# Patient Record
Sex: Female | Born: 1955 | Race: Black or African American | Hispanic: No | Marital: Single | Smoking: Former smoker
Health system: Southern US, Community
[De-identification: ages and names within clinical notes are randomized; demographics above are authoritative.]

## PROBLEM LIST (undated history)

## (undated) DIAGNOSIS — D1803 Hemangioma of intra-abdominal structures: Secondary | ICD-10-CM

## (undated) DIAGNOSIS — Z8619 Personal history of other infectious and parasitic diseases: Secondary | ICD-10-CM

## (undated) DIAGNOSIS — M25562 Pain in left knee: Secondary | ICD-10-CM

## (undated) DIAGNOSIS — Z9889 Other specified postprocedural states: Secondary | ICD-10-CM

## (undated) DIAGNOSIS — G8929 Other chronic pain: Secondary | ICD-10-CM

## (undated) DIAGNOSIS — M8889 Osteitis deformans of multiple sites: Secondary | ICD-10-CM

## (undated) DIAGNOSIS — M25561 Pain in right knee: Secondary | ICD-10-CM

## (undated) DIAGNOSIS — F4321 Adjustment disorder with depressed mood: Secondary | ICD-10-CM

## (undated) DIAGNOSIS — Z87898 Personal history of other specified conditions: Secondary | ICD-10-CM

## (undated) DIAGNOSIS — T783XXA Angioneurotic edema, initial encounter: Secondary | ICD-10-CM

## (undated) DIAGNOSIS — Z634 Disappearance and death of family member: Secondary | ICD-10-CM

## (undated) DIAGNOSIS — B019 Varicella without complication: Secondary | ICD-10-CM

## (undated) DIAGNOSIS — G5601 Carpal tunnel syndrome, right upper limb: Secondary | ICD-10-CM

## (undated) HISTORY — DX: Angioneurotic edema, initial encounter: T78.3XXA

## (undated) HISTORY — DX: Pain in right knee: M25.561

## (undated) HISTORY — DX: Personal history of other infectious and parasitic diseases: Z86.19

## (undated) HISTORY — DX: Other specified postprocedural states: Z98.890

## (undated) HISTORY — DX: Other disorders of bilirubin metabolism: E80.6

## (undated) HISTORY — DX: Disappearance and death of family member: Z63.4

## (undated) HISTORY — DX: Personal history of other specified conditions: Z87.898

## (undated) HISTORY — DX: Adjustment disorder with depressed mood: F43.21

## (undated) HISTORY — DX: Varicella without complication: B01.9

## (undated) HISTORY — DX: Other chronic pain: G89.29

## (undated) HISTORY — DX: Pain in left knee: M25.562

## (undated) HISTORY — DX: Osteitis deformans of multiple sites: M88.89

## (undated) HISTORY — DX: Carpal tunnel syndrome, right upper limb: G56.01

## (undated) HISTORY — DX: Hemangioma of intra-abdominal structures: D18.03

---

## 1980-03-28 HISTORY — PX: BREAST CYST EXCISION: SHX579

## 1988-03-28 HISTORY — PX: TUBAL LIGATION: SHX77

## 2000-03-28 HISTORY — PX: BREAST REDUCTION SURGERY: SHX8

## 2002-03-28 HISTORY — PX: UMBILICAL HERNIA REPAIR: SHX196

## 2009-07-31 ENCOUNTER — Emergency Department (HOSPITAL_COMMUNITY): Admission: EM | Admit: 2009-07-31 | Discharge: 2009-07-31 | Payer: Self-pay | Admitting: Emergency Medicine

## 2015-03-29 HISTORY — PX: LASIK: SHX215

## 2016-09-06 ENCOUNTER — Other Ambulatory Visit (HOSPITAL_COMMUNITY)
Admission: RE | Admit: 2016-09-06 | Discharge: 2016-09-06 | Disposition: A | Discharge status: Home / Self Care | Source: Ambulatory Visit | Attending: Family Medicine | Admitting: Family Medicine

## 2016-09-06 ENCOUNTER — Other Ambulatory Visit: Payer: Self-pay | Admitting: Family Medicine

## 2016-09-06 DIAGNOSIS — Z113 Encounter for screening for infections with a predominantly sexual mode of transmission: Secondary | ICD-10-CM | POA: Diagnosis not present

## 2016-09-09 ENCOUNTER — Ambulatory Visit
Admission: RE | Admit: 2016-09-09 | Discharge: 2016-09-09 | Disposition: A | Discharge status: Home / Self Care | Source: Ambulatory Visit | Attending: Family Medicine | Admitting: Family Medicine

## 2016-09-09 ENCOUNTER — Other Ambulatory Visit: Payer: Self-pay | Admitting: Family Medicine

## 2016-09-09 DIAGNOSIS — R0981 Nasal congestion: Secondary | ICD-10-CM

## 2016-09-12 LAB — URINE CYTOLOGY ANCILLARY ONLY
Bacterial vaginitis: POSITIVE — AB
CHLAMYDIA, DNA PROBE: NEGATIVE
Candida vaginitis: NEGATIVE
NEISSERIA GONORRHEA: NEGATIVE
Trichomonas: NEGATIVE

## 2016-11-30 ENCOUNTER — Other Ambulatory Visit: Payer: Self-pay | Admitting: Family Medicine

## 2016-11-30 ENCOUNTER — Ambulatory Visit
Admission: RE | Admit: 2016-11-30 | Discharge: 2016-11-30 | Disposition: A | Discharge status: Home / Self Care | Source: Ambulatory Visit | Attending: Family Medicine | Admitting: Family Medicine

## 2016-11-30 DIAGNOSIS — M79671 Pain in right foot: Secondary | ICD-10-CM

## 2016-12-02 ENCOUNTER — Encounter (INDEPENDENT_AMBULATORY_CARE_PROVIDER_SITE_OTHER): Payer: Self-pay | Admitting: Neurology

## 2016-12-02 ENCOUNTER — Ambulatory Visit (INDEPENDENT_AMBULATORY_CARE_PROVIDER_SITE_OTHER): Admitting: Neurology

## 2016-12-02 DIAGNOSIS — R202 Paresthesia of skin: Secondary | ICD-10-CM | POA: Diagnosis not present

## 2016-12-02 DIAGNOSIS — G5601 Carpal tunnel syndrome, right upper limb: Secondary | ICD-10-CM

## 2016-12-02 DIAGNOSIS — Z0289 Encounter for other administrative examinations: Secondary | ICD-10-CM

## 2016-12-02 HISTORY — DX: Carpal tunnel syndrome, right upper limb: G56.01

## 2016-12-02 NOTE — Procedures (Signed)
Full Name: Cataleyah Colborn Gender: Female MRN #: [id number] Date of Birth: 2055/09/15    Visit Date: 12/02/16 08:19 Age: 61 Years 91 Months Old Examining Physician: Marcial Pacas, MD  Referring Physician: Dr. Lucianne Lei, MD History:  61 years old right-handed female presenting with intermittent bilateral hands paresthesia, right worse than left.  Summary of the test:  Nerve conduction study: Right median mixed response was 0.4 milliseconds prolonged in comparison to the ipsilateral ulnar mixed response. Bilateral median, right ulnar sensory responses were normal. Bilateral median, right ulnar motor responses were normal.  Electromyography: Selected needle examination of right upper extremity and right cervical paraspinal muscles were normal.  Conclusion: This is a mild abnormal study. There is electrodiagnostic evidence of right median neuropathy across the wrist, consistent with mild right carpal tunnel syndromes. There is no evidence of right cervical radiculopathy.    ------------------------------- Marcial Pacas, M.D.  [facility] [place], [place] [address] Tel: [phone] Fax: [phone]        [facility]    Nerve / Sites Muscle Latency Ref. Amplitude Ref. Rel Amp Segments Distance Velocity Ref. Area    ms ms mV mV %  cm m/s m/s mVms  L Median - APB     Wrist APB 2.8 ?4.4 8.4 ?4.0 100 Wrist - APB 7   33.8     Upper arm APB 6.6  8.3  98.6 Upper arm - Wrist 21 56 ?49 30.3  R Median - APB     Wrist APB 3.1 ?4.4 8.7 ?4.0 100 Wrist - APB 7   32.4     Upper arm APB 6.8  8.5  98.3 Upper arm - Wrist   ?49 32.1  R Ulnar - ADM     Wrist ADM 2.2 ?3.3 7.9 ?6.0 100 Wrist - ADM 7   27.7     B.Elbow ADM 5.5  7.4  93.6 B.Elbow - Wrist 18 55 ?49 26.9     A.Elbow ADM 7.8  7.4  99.3 A.Elbow - B.Elbow 12 51 ?49 27.0         A.Elbow - Wrist               SNC    Nerve / Sites Rec. Site Peak Lat Ref.  Amp Ref. Segments Distance Peak Diff Ref.    ms ms V  V  cm ms ms  L Median, Ulnar - Transcarpal comparison     Median Palm Wrist 1.9 ?2.2 50 ?35 Median Palm - Wrist 8       Ulnar Palm Wrist 2.0 ?2.2 14 ?12 Ulnar Palm - Wrist 8          Median Palm - Ulnar Palm  -0.1 ?0.4  R Median, Ulnar - Transcarpal comparison     Median Palm Wrist 2.3 ?2.2 41 ?35 Median Palm - Wrist 8       Ulnar Palm Wrist 1.9 ?2.2 11 ?12 Ulnar Palm - Wrist 8          Median Palm - Ulnar Palm  0.4 ?0.4  L Median - Orthodromic (Dig II, Mid palm)     Dig II Wrist 2.8 ?3.4 34 ?10 Dig II - Wrist 13    R Median - Orthodromic (Dig II, Mid palm)     Dig II Wrist 3.1 ?3.4 12 ?10 Dig II - Wrist 13    R Ulnar - Orthodromic, (Dig V, Mid palm)     Dig V Wrist  2.8 ?3.1 9 ?5 Dig V - Wrist 26                 F  Wave    Nerve F Lat Ref.   ms ms  R Ulnar - ADM 25.7 ?32.0       EMG full       EMG Summary Table    Spontaneous MUAP Recruitment  Muscle IA Fib PSW Fasc Other Amp Dur. Poly Pattern  R. First dorsal interosseous Normal None None None _______ Normal Normal Normal Normal  R. Pronator teres Normal None None None _______ Normal Normal Normal Normal  R. Deltoid Normal None None None _______ Normal Normal Normal Normal  R. Biceps brachii Normal None None None _______ Normal Normal Normal Normal  R. Extensor digitorum communis Normal None None None _______ Normal Normal Normal Normal  R. Cervical paraspinals Normal None None None _______ Normal Normal Normal Normal

## 2017-03-02 ENCOUNTER — Other Ambulatory Visit: Payer: Self-pay | Admitting: Family Medicine

## 2017-03-02 DIAGNOSIS — K769 Liver disease, unspecified: Secondary | ICD-10-CM

## 2017-03-08 ENCOUNTER — Telehealth: Payer: Self-pay | Admitting: Radiology

## 2017-03-08 ENCOUNTER — Inpatient Hospital Stay
Admission: RE | Admit: 2017-03-08 | Discharge: 2017-03-08 | Disposition: A | Discharge status: Home / Self Care | Source: Ambulatory Visit | Attending: Family Medicine | Admitting: Family Medicine

## 2017-03-08 NOTE — Telephone Encounter (Signed)
Called 13 hour prep to [facility]. Called pt and explained prep. First dose 12/13  8 pm, 2nd dose 12/14 2 am,3rd dose  12/14 8 am with Benadryl 50 mg po.

## 2017-03-10 ENCOUNTER — Ambulatory Visit
Admission: RE | Admit: 2017-03-10 | Discharge: 2017-03-10 | Disposition: A | Discharge status: Home / Self Care | Source: Ambulatory Visit | Attending: Family Medicine | Admitting: Family Medicine

## 2017-03-10 DIAGNOSIS — K769 Liver disease, unspecified: Secondary | ICD-10-CM

## 2017-03-10 MED ORDER — IOPAMIDOL (ISOVUE-300) INJECTION 61%
100.0000 mL | Freq: Once | INTRAVENOUS | Status: AC | PRN
  Administered 2017-03-10: 100 mL via INTRAVENOUS

## 2017-03-14 ENCOUNTER — Other Ambulatory Visit: Payer: Self-pay | Admitting: Family Medicine

## 2017-03-14 ENCOUNTER — Ambulatory Visit
Admission: RE | Admit: 2017-03-14 | Discharge: 2017-03-14 | Disposition: A | Discharge status: Home / Self Care | Source: Ambulatory Visit | Attending: Family Medicine | Admitting: Family Medicine

## 2017-03-14 DIAGNOSIS — S32302A Unspecified fracture of left ilium, initial encounter for closed fracture: Secondary | ICD-10-CM

## 2017-05-23 LAB — HM MAMMOGRAPHY

## 2017-06-16 LAB — HM MAMMOGRAPHY

## 2017-11-14 ENCOUNTER — Other Ambulatory Visit: Payer: Self-pay | Admitting: Family Medicine

## 2017-11-14 ENCOUNTER — Ambulatory Visit
Admission: RE | Admit: 2017-11-14 | Discharge: 2017-11-14 | Disposition: A | Discharge status: Home / Self Care | Source: Ambulatory Visit | Attending: Family Medicine | Admitting: Family Medicine

## 2017-11-14 DIAGNOSIS — M130 Polyarthritis, unspecified: Secondary | ICD-10-CM

## 2017-11-14 DIAGNOSIS — J0100 Acute maxillary sinusitis, unspecified: Secondary | ICD-10-CM

## 2018-01-02 DIAGNOSIS — M25562 Pain in left knee: Secondary | ICD-10-CM

## 2018-01-02 DIAGNOSIS — G8929 Other chronic pain: Secondary | ICD-10-CM | POA: Insufficient documentation

## 2018-01-02 DIAGNOSIS — M545 Low back pain, unspecified: Secondary | ICD-10-CM | POA: Insufficient documentation

## 2018-01-02 DIAGNOSIS — M25561 Pain in right knee: Secondary | ICD-10-CM

## 2018-01-02 HISTORY — DX: Other chronic pain: G89.29

## 2018-05-25 ENCOUNTER — Telehealth: Payer: Self-pay | Admitting: Family Medicine

## 2018-05-25 NOTE — Telephone Encounter (Signed)
Called pt to reschedule appt on 06/26/2018 for NP appt. Informed pt that next available was July. Pt would like sooner if possible. Please advise.

## 2018-05-28 NOTE — Telephone Encounter (Signed)
Patient is calling back to see if she can be worked in sooner. Please advise. Thank you

## 2018-05-28 NOTE — Telephone Encounter (Signed)
Okay to use your discretion.

## 2018-05-28 NOTE — Telephone Encounter (Signed)
Call patient to work into same day slot

## 2018-06-14 DIAGNOSIS — Z9889 Other specified postprocedural states: Secondary | ICD-10-CM

## 2018-06-14 HISTORY — DX: Other specified postprocedural states: Z98.890

## 2018-06-15 ENCOUNTER — Ambulatory Visit (INDEPENDENT_AMBULATORY_CARE_PROVIDER_SITE_OTHER): Admitting: Family Medicine

## 2018-06-15 ENCOUNTER — Other Ambulatory Visit: Payer: Self-pay

## 2018-06-15 ENCOUNTER — Ambulatory Visit (INDEPENDENT_AMBULATORY_CARE_PROVIDER_SITE_OTHER)

## 2018-06-15 ENCOUNTER — Encounter: Payer: Self-pay | Admitting: Family Medicine

## 2018-06-15 VITALS — BP 120/78 | HR 66 | Temp 98.1°F | Ht 64.0 in | Wt 171.5 lb

## 2018-06-15 DIAGNOSIS — G8929 Other chronic pain: Secondary | ICD-10-CM | POA: Diagnosis not present

## 2018-06-15 DIAGNOSIS — M25551 Pain in right hip: Secondary | ICD-10-CM | POA: Diagnosis not present

## 2018-06-15 DIAGNOSIS — D1803 Hemangioma of intra-abdominal structures: Secondary | ICD-10-CM

## 2018-06-15 DIAGNOSIS — M533 Sacrococcygeal disorders, not elsewhere classified: Secondary | ICD-10-CM

## 2018-06-15 DIAGNOSIS — M545 Low back pain, unspecified: Secondary | ICD-10-CM

## 2018-06-15 DIAGNOSIS — F4321 Adjustment disorder with depressed mood: Secondary | ICD-10-CM

## 2018-06-15 DIAGNOSIS — G5601 Carpal tunnel syndrome, right upper limb: Secondary | ICD-10-CM

## 2018-06-15 DIAGNOSIS — Z634 Disappearance and death of family member: Secondary | ICD-10-CM

## 2018-06-15 DIAGNOSIS — I5181 Takotsubo syndrome: Secondary | ICD-10-CM

## 2018-06-15 DIAGNOSIS — M8889 Osteitis deformans of multiple sites: Secondary | ICD-10-CM

## 2018-06-15 DIAGNOSIS — Z87898 Personal history of other specified conditions: Secondary | ICD-10-CM

## 2018-06-15 NOTE — Progress Notes (Signed)
Natalie Cline is a 63 y.o. female is here to [place].   Patient Care Team: Briscoe Deutscher, DO as PCP - General (Family Medicine)   History of Present Illness:   HPI: This is a pleasant 63 year old woman presenting for new patient visit.  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  She does have chronic pain in those areas but is active and has good range of motion.  She is concerned that this has not been followed up.  She also reports that she will have an MRI in the near future to evaluate a liver lesion.  Review of her history shows that she had a CT in 2016/07/03 that revealed a liver hemangioma.  It appears benign.  A chart review also reveals an elevated bilirubin lab.  Patient reports that she will be getting thyroid labs in the next week, again at the [place].  Patient had multiple exposures during her career in the [employer].  She never went to [place], but was in [employer] during the 90s and stationed in [place], [place], and [place].  After early retirement from [employer], she worked in [employer] another viable department for many years.  She retired.  Now she works very part-time at [employer] in the [employer]  Patient eats well and tries exercise.  She is married.  She has 3 children, all vaginal deliveries.  In 07/04/2014 her son died due to "hypertension."  The patient is seeing a counselor to continue coping mechanisms for her grief.  She has 2 daughters that are both healthy.  In 2014/07/04, shortly after her son's death, she had chest pain and was worked up with normal labs and images and studies.  Patient states that she was told she had broken heart syndrome.  Health Maintenance Due  Topic Date Due  . Hepatitis C Screening  1957/03/13  . HIV Screening  05/12/1971   Depression screen PHQ 2/9 06/15/2018  Decreased Interest 0  Down, Depressed,  Hopeless 0  PHQ - 2 Score 0    PMHx, SurgHx, SocialHx, Medications, and Allergies were reviewed in the Visit Navigator and updated as appropriate.   Past Medical History:  Diagnosis Date  . Bilateral chronic knee pain 01/02/2018  . Grief at loss of child, son died in his 70s of heart disease 06/17/2018  . History of chest pain, 2014-07-04, after loss of son, normal cardiac testing 06/17/2018  . History of chicken pox   . History of genital warts   . Hx of breast reduction, elective 06/14/2018  . Hyperbilirubinemia 06/17/2018  . Liver hemangioma 06/17/2018   CT July 03, 2016. Hepatobiliary: Normal liver size. No evidence of liver surface irregularity. There is a 2.3 x 2.3 cm superior right liver lobe mass (series 4/image 20) demonstrating progressive discontinuous peripheral nodular enhancement, compatible with a benign hemangioma. There are 3 scattered subcentimeter hypodense lesions in the right liver lobe, too small to characterize, which require no follow-  . Mild carpal tunnel syndrome of right wrist 12/02/2016  . 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 Vaginal delivery, x 3      Past Surgical History:  Procedure Laterality Date  . BREAST REDUCTION SURGERY Bilateral 07/03/2000  . LASIK Bilateral 07-04-15  . TUBAL LIGATION  1990  . UMBILICAL HERNIA REPAIR  07/04/02  Family History  Problem Relation Age of Onset  . Hypertension Son     Social History   Tobacco Use  . Smoking status: Former Smoker    Years: 2.00  . Smokeless tobacco: Never Used  Substance Use Topics  . Alcohol use: Yes    Comment: 1-2 glasses of wine per month  . Drug use: Never    Current Medications and Allergies   .  ALLERGY NON-DROWSY 10 MG tablet, Take 10 mg by mouth daily as needed. , Disp: , Rfl:  .  baclofen (LIORESAL) 10 MG tablet, Take 10 mg by mouth daily., Disp: , Rfl:  .  calcium-vitamin D 250-100 MG-UNIT tablet, Take 1 tablet by mouth daily., Disp: , Rfl:  .  diclofenac (VOLTAREN) 75 MG EC tablet,  Take 75 mg by mouth daily., Disp: , Rfl:  .  guaifenesin (HUMIBID E) 400 MG TABS tablet, Take 400 mg by mouth as needed. , Disp: , Rfl:  .  Multiple Vitamin (MULTIVITAMIN) tablet, Take 1 tablet by mouth daily., Disp: , Rfl:  .  valACYclovir (VALTREX) 1000 MG tablet, Take 1,000 mg by mouth daily. , Disp: , Rfl:    Allergies  Allergen Reactions  . Iodinated Diagnostic Agents Hives    Per patient   . Penicillins Other (See Comments)   Review of Systems   Pertinent items are noted in the HPI. Otherwise, a complete ROS is negative.  Vitals   Vitals:   06/15/18 1137  BP: 120/78  Pulse: 66  Temp: 98.1 F (36.7 C)  TempSrc: Oral  SpO2: 99%  Weight: 171 lb 8 oz (77.8 kg)  Height: 5\' 4"  (1.626 m)     Body mass index is 29.44 kg/m.  Physical Exam   Physical Exam Vitals signs and nursing note reviewed.  Constitutional:      General: She is not in acute distress.    Appearance: She is well-developed.  HENT:     Head: Normocephalic and atraumatic.     Right Ear: External ear normal.     Left Ear: External ear normal.     Nose: Nose normal.  Eyes:     Conjunctiva/sclera: Conjunctivae normal.     Pupils: Pupils are equal, round, and reactive to light.  Neck:     Musculoskeletal: Normal range of motion and neck supple.     Thyroid: No thyromegaly.  Cardiovascular:     Rate and Rhythm: Normal rate and regular rhythm.     Heart sounds: Normal heart sounds.  Pulmonary:     Effort: Pulmonary effort is normal.     Breath sounds: Normal breath sounds.  Abdominal:     General: Bowel sounds are normal.     Palpations: Abdomen is soft.  Musculoskeletal: Normal range of motion.     Lumbar back: She exhibits bony tenderness. She exhibits normal range of motion.       Back:  Lymphadenopathy:     Cervical: No cervical adenopathy.  Skin:    General: Skin is warm and dry.     Capillary Refill: Capillary refill takes less than 2 seconds.  Neurological:     Mental Status: She is  alert and oriented to person, place, and time.  Psychiatric:        Behavior: Behavior normal.     Assessment and Plan   Natalie Cline was seen today for establish care and back pain.  X-rays completed today.  Will review records thoroughly.  Will request records from [place].  Will hold  off on labs today since she will be having them in the near future at the [place].  We have asked her to send [place] many records as possible to [place] ASAP.  Diagnoses and all orders for this visit:  Pain of right hip joint -     DG Hip Unilat W OR W/O Pelvis Min 4 Views Right; Future  Lumbar pain -     DG Lumbar Spine 2-3 Views; Future  Chronic SI joint pain -     DG Si Joints; Future  Mild carpal tunnel syndrome of right wrist  Hyperbilirubinemia  Liver hemangioma  Broken heart syndrome  Grief at loss of child, son died in his 63s of heart disease  Paget's disease of bone at multiple sites  History of chest pain, 2016, after loss of son, normal cardiac testing  . Orders and follow up as documented in [place], reviewed diet, exercise and weight control, cardiovascular risk and specific lipid/LDL goals reviewed, reviewed medications and side effects in detail.  . Reviewed expectations re: course of current medical issues. . Outlined signs and symptoms indicating need for more acute intervention. . Patient verbalized understanding and all questions were answered. . Patient received an After Visit Summary.  Briscoe Deutscher, DO [place] [place] 06/17/2018  Records requested if needed. Time spent with the patient: 45 minutes, of which >50% was spent in obtaining information about her symptoms, reviewing her previous labs, evaluations, and treatments, counseling her about her condition (please see the discussed topics above), and developing a plan to further investigate it; she had a number of questions which I addressed.

## 2018-06-16 ENCOUNTER — Encounter: Payer: Self-pay | Admitting: Family Medicine

## 2018-06-17 ENCOUNTER — Encounter: Payer: Self-pay | Admitting: Family Medicine

## 2018-06-17 DIAGNOSIS — F4321 Adjustment disorder with depressed mood: Secondary | ICD-10-CM | POA: Insufficient documentation

## 2018-06-17 DIAGNOSIS — M8889 Osteitis deformans of multiple sites: Secondary | ICD-10-CM

## 2018-06-17 DIAGNOSIS — Z634 Disappearance and death of family member: Secondary | ICD-10-CM

## 2018-06-17 DIAGNOSIS — I5181 Takotsubo syndrome: Secondary | ICD-10-CM | POA: Insufficient documentation

## 2018-06-17 DIAGNOSIS — D1803 Hemangioma of intra-abdominal structures: Secondary | ICD-10-CM

## 2018-06-17 DIAGNOSIS — Z87898 Personal history of other specified conditions: Secondary | ICD-10-CM | POA: Insufficient documentation

## 2018-06-17 DIAGNOSIS — G5601 Carpal tunnel syndrome, right upper limb: Secondary | ICD-10-CM | POA: Insufficient documentation

## 2018-06-17 HISTORY — DX: Other disorders of bilirubin metabolism: E80.6

## 2018-06-17 HISTORY — DX: Adjustment disorder with depressed mood: Z63.4

## 2018-06-17 HISTORY — DX: Osteitis deformans of multiple sites: M88.89

## 2018-06-17 HISTORY — DX: Hemangioma of intra-abdominal structures: D18.03

## 2018-06-17 HISTORY — DX: Personal history of other specified conditions: Z87.898

## 2018-06-17 HISTORY — DX: Adjustment disorder with depressed mood: F43.21

## 2018-06-17 NOTE — Assessment & Plan Note (Signed)
CT 2018. Hepatobiliary: Normal liver size. No evidence of liver surface irregularity. There is a 2.3 x 2.3 cm superior right liver lobe mass (series 4/image 20) demonstrating progressive discontinuous peripheral nodular enhancement, compatible with a benign hemangioma. There are 3 scattered subcentimeter hypodense lesions in the right liver lobe, too small to characterize, which require no follow-up unless the patient has risk factors for liver malignancy. No additional liver masses. Normal gallbladder with no radiopaque cholelithiasis. No biliary ductal dilatation.  Patient reports that she has an upcoming MRI at the [facility] for further evaluation.

## 2018-06-26 ENCOUNTER — Ambulatory Visit: Admitting: Family Medicine

## 2018-07-21 DIAGNOSIS — A630 Anogenital (venereal) warts: Secondary | ICD-10-CM | POA: Insufficient documentation

## 2018-07-26 ENCOUNTER — Other Ambulatory Visit: Payer: Self-pay

## 2018-07-26 ENCOUNTER — Telehealth: Payer: Self-pay | Admitting: Family Medicine

## 2018-07-26 ENCOUNTER — Encounter: Payer: Self-pay | Admitting: Family Medicine

## 2018-07-26 ENCOUNTER — Emergency Department (HOSPITAL_COMMUNITY)

## 2018-07-26 ENCOUNTER — Encounter (HOSPITAL_COMMUNITY): Payer: Self-pay

## 2018-07-26 ENCOUNTER — Ambulatory Visit: Payer: Self-pay | Admitting: Family Medicine

## 2018-07-26 ENCOUNTER — Emergency Department (HOSPITAL_COMMUNITY)
Admission: EM | Admit: 2018-07-26 | Discharge: 2018-07-26 | Disposition: A | Discharge status: Home / Self Care | Attending: Emergency Medicine | Admitting: Emergency Medicine

## 2018-07-26 ENCOUNTER — Ambulatory Visit (HOSPITAL_COMMUNITY)
Admission: EM | Admit: 2018-07-26 | Discharge: 2018-07-26 | Disposition: A | Discharge status: Home / Self Care | Source: Home / Self Care

## 2018-07-26 DIAGNOSIS — R072 Precordial pain: Secondary | ICD-10-CM | POA: Insufficient documentation

## 2018-07-26 DIAGNOSIS — Z87891 Personal history of nicotine dependence: Secondary | ICD-10-CM | POA: Insufficient documentation

## 2018-07-26 DIAGNOSIS — R079 Chest pain, unspecified: Secondary | ICD-10-CM | POA: Diagnosis present

## 2018-07-26 DIAGNOSIS — Z79899 Other long term (current) drug therapy: Secondary | ICD-10-CM | POA: Diagnosis not present

## 2018-07-26 LAB — COMPREHENSIVE METABOLIC PANEL
ALT: 11 U/L (ref 0–44)
AST: 17 U/L (ref 15–41)
Albumin: 3.6 g/dL (ref 3.5–5.0)
Alkaline Phosphatase: 98 U/L (ref 38–126)
Anion gap: 7 (ref 5–15)
BUN: 10 mg/dL (ref 8–23)
CO2: 28 mmol/L (ref 22–32)
Calcium: 9.3 mg/dL (ref 8.9–10.3)
Chloride: 106 mmol/L (ref 98–111)
Creatinine, Ser: 0.83 mg/dL (ref 0.44–1.00)
GFR calc Af Amer: >60 mL/min (ref >60)
GFR calc non Af Amer: >60 mL/min (ref >60)
Glucose, Bld: 92 mg/dL (ref 70–99)
Potassium: 4.1 mmol/L (ref 3.5–5.1)
Sodium: 141 mmol/L (ref 135–145)
Total Bilirubin: 0.8 mg/dL (ref 0.3–1.2)
Total Protein: 6.4 g/dL — ABNORMAL LOW (ref 6.5–8.1)

## 2018-07-26 LAB — CBC WITH DIFFERENTIAL/PLATELET
Abs Immature Granulocytes: 0.01 10*3/uL (ref 0.00–0.07)
Basophils Absolute: 0 10*3/uL (ref 0.0–0.1)
Basophils Relative: 1 %
Eosinophils Absolute: 0 10*3/uL (ref 0.0–0.5)
Eosinophils Relative: 1 %
HCT: 39.4 % (ref 36.0–46.0)
Hemoglobin: 12.3 g/dL (ref 12.0–15.0)
Immature Granulocytes: 0 %
Lymphocytes Relative: 44 %
Lymphs Abs: 2.2 10*3/uL (ref 0.7–4.0)
MCH: 27.2 pg (ref 26.0–34.0)
MCHC: 31.2 g/dL (ref 30.0–36.0)
MCV: 87 fL (ref 80.0–100.0)
Monocytes Absolute: 0.4 10*3/uL (ref 0.1–1.0)
Monocytes Relative: 8 %
Neutro Abs: 2.4 10*3/uL (ref 1.7–7.7)
Neutrophils Relative %: 46 %
Platelets: 252 10*3/uL (ref 150–400)
RBC: 4.53 MIL/uL (ref 3.87–5.11)
RDW: 14.5 % (ref 11.5–15.5)
WBC: 5.1 10*3/uL (ref 4.0–10.5)
nRBC: 0 % (ref 0.0–0.2)

## 2018-07-26 LAB — TROPONIN I: Troponin I: <0.03 ng/mL (ref <0.03)

## 2018-07-26 NOTE — Telephone Encounter (Signed)
Pt. Reports she has had chest pain on and off x 2 weeks. States pain is in the middle of her chest above her breast. Does not radiate. Has occasional nausea with this. Pain last a few seconds and is 7-8/10. Pain is a stabbing pain. Has been trying Pepto and Prilosec without relief. States she was in the hospital 4 years ago with "broken heart syndrome." Pt. Will go to ED for evaluation. Will have her daughter take her.  Reason for Disposition . Patient sounds very sick or weak to the triager  Answer Assessment - Initial Assessment Questions 1. LOCATION: "Where does it hurt?"       Center above the breast 2. RADIATION: "Does the pain go anywhere else?" (e.g., into neck, jaw, arms, back)     No 3. ONSET: "When did the chest pain begin?" (Minutes, hours or days)      2 weeks ago 4. PATTERN "Does the pain come and go, or has it been constant since it started?"  "Does it get worse with exertion?"      Comes and goes  5. DURATION: "How long does it last" (e.g., seconds, minutes, hours)     Stabbing pain - a few seconds 6. SEVERITY: "How bad is the pain?"  (e.g., Scale 1-10; mild, moderate, or severe)    - MILD (1-3): doesn't interfere with normal activities     - MODERATE (4-7): interferes with normal activities or awakens from sleep    - SEVERE (8-10): excruciating pain, unable to do any normal activities       7-8 7. CARDIAC RISK FACTORS: "Do you have any history of heart problems or risk factors for heart disease?" (e.g., prior heart attack, angina; high blood pressure, diabetes, being overweight, high cholesterol, smoking, or strong family history of heart disease)     4 years ago had broken heart syndrome 8. PULMONARY RISK FACTORS: "Do you have any history of lung disease?"  (e.g., blood clots in lung, asthma, emphysema, birth control pills)     No 9. CAUSE: "What do you think is causing the chest pain?"     Unsure 10. OTHER SYMPTOMS: "Do you have any other symptoms?" (e.g., dizziness,  nausea, vomiting, sweating, fever, difficulty breathing, cough)       Some nausea - something in my throat. Like it's my food 11. PREGNANCY: "Is there any chance you are pregnant?" "When was your last menstrual period?"       No  Protocols used: CHEST PAIN-A-AH

## 2018-07-26 NOTE — ED Provider Notes (Signed)
[facility] EMERGENCY DEPARTMENT Provider Note   CSN: [id number] Arrival date & time: 07/26/18  0848    History   Chief Complaint Chief Complaint  Patient presents with  . Gastroesophageal Reflux  . Chest Pain    HPI Natalie Cline is a 63 y.o. female.     HPI Patient is a 63 year old female presents to the emergency department with 2 weeks of intermittent pressure in her anterior chest without significant radiation.  She is continued to exercise over the past several weeks without exertional chest discomfort.  No family history of early cardiac disease and parents or siblings.  Denies a history of hypertension, hyperlipidemia, diabetes.  Denies tobacco abuse.  She reports that she eats healthy.  She is on omeprazole and reports this has not been improving.  This was started by the [facility] hospital several months ago for similar related symptoms.  She has not seen gastroenterology.  She does feel like she has had some slight weight gain loss over the past several weeks but she attributes this to increased exercise and good nutrition.  She occasionally feels a fluttering sensation.  No lightheadedness.  No syncope.  No other change in medications.   Past Medical History:  Diagnosis Date  . Bilateral chronic knee pain 01/02/2018  . Chicken pox   . Grief at loss of child, son died in his 85s of heart disease 06/17/2018  . History of chest pain, 2016, after loss of son, normal cardiac testing 06/17/2018  . History of chicken pox   . History of genital warts   . Hx of breast reduction, elective 06/14/2018  . Hyperbilirubinemia 06/17/2018  . Liver hemangioma 06/17/2018   CT 2018. Hepatobiliary: Normal liver size. No evidence of liver surface irregularity. There is a 2.3 x 2.3 cm superior right liver lobe mass (series 4/image 20) demonstrating progressive discontinuous peripheral nodular enhancement, compatible with a benign hemangioma. There are 3 scattered subcentimeter  hypodense lesions in the right liver lobe, too small to characterize, which require no follow-  . Mild carpal tunnel syndrome of right wrist 12/02/2016  . 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 Vaginal delivery, x 3     Patient Active Problem List   Diagnosis Date Noted  . Genital warts 07/21/2018  . Mild carpal tunnel syndrome of right wrist 06/17/2018  . Hyperbilirubinemia 06/17/2018  . Liver hemangioma 06/17/2018  . Grief at loss of child, son died in his 55s of heart disease 06/17/2018  . 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 History of chest pain, 2016, after loss of son, normal cardiac testing 06/17/2018  . Hx of breast reduction, elective 06/14/2018  . Bilateral chronic knee pain 01/02/2018    Past Surgical History:  Procedure Laterality Date  . BREAST CYST EXCISION Left 1982   removed from right breast   . BREAST REDUCTION SURGERY Bilateral 2002  . LASIK Bilateral 2017  . TUBAL LIGATION  1990  . UMBILICAL HERNIA REPAIR  2004     OB History   No obstetric history on file.      Home Medications    Prior to Admission medications   Medication Sig Start Date End Date Taking? Authorizing Provider  ALLERGY NON-DROWSY 10 MG tablet Take 10 mg by mouth daily as needed.  02/01/18   [provider]  baclofen (LIORESAL) 10 MG tablet Take 10 mg by mouth daily. 02/01/18   [provider]  calcium-vitamin D 250-100 MG-UNIT  tablet Take 1 tablet by mouth daily.    [provider]  diclofenac (VOLTAREN) 75 MG EC tablet Take 75 mg by mouth daily. 02/01/18   [provider]  guaifenesin (HUMIBID E) 400 MG TABS tablet Take 400 mg by mouth as needed.  04/06/18   [provider]  Multiple Vitamin (MULTIVITAMIN) tablet Take 1 tablet by mouth daily.    [provider]  valACYclovir (VALTREX) 1000 MG tablet Take 1,000 mg by mouth daily.  04/10/18   [provider]    Family History Family History   Problem Relation Age of Onset  . Hypertension Son   . Stroke Mother   . Alcohol abuse Father   . Hypertension Sister   . Stroke Brother   . Alcohol abuse Sister   . Drug abuse Sister   . Hypertension Sister   . Stroke Sister   . Early death Son   . Heart disease Son     Social History Social History   Tobacco Use  . Smoking status: Former Smoker    Years: 2.00  . Smokeless tobacco: Never Used  Substance Use Topics  . Alcohol use: Yes    Comment: 1-2 glasses of wine per month  . Drug use: Never     Allergies   Iodinated diagnostic agents and Penicillins   Review of Systems Review of Systems  All other systems reviewed and are negative.    Physical Exam Updated Vital Signs BP 108/67 (BP Location: Right Arm) Comment: Simultaneous filing. User may not have seen previous data. Comment (BP Location): Simultaneous filing. User may not have seen previous data.  Pulse 68 Comment: Simultaneous filing. User may not have seen previous data.  Temp 98.3 F (36.8 C) (Oral)   Resp 16 Comment: Simultaneous filing. User may not have seen previous data.  Ht 5\' 5"  (1.651 m)   Wt 77.1 kg   SpO2 100% Comment: Simultaneous filing. User may not have seen previous data.  BMI 28.29 kg/m   Physical Exam Vitals signs and nursing note reviewed.  Constitutional:      General: She is not in acute distress.    Appearance: She is well-developed.  HENT:     Head: Normocephalic and atraumatic.  Neck:     Musculoskeletal: Normal range of motion.  Cardiovascular:     Rate and Rhythm: Normal rate and regular rhythm.     Heart sounds: Normal heart sounds.  Pulmonary:     Effort: Pulmonary effort is normal.     Breath sounds: Normal breath sounds.  Abdominal:     General: There is no distension.     Palpations: Abdomen is soft.     Tenderness: There is no abdominal tenderness.  Musculoskeletal: Normal range of motion.  Skin:    General: Skin is warm and dry.  Neurological:      Mental Status: She is alert and oriented to person, place, and time.  Psychiatric:        Judgment: Judgment normal.      ED Treatments / Results  Labs (all labs ordered are listed, but only abnormal results are displayed) Labs Reviewed  COMPREHENSIVE METABOLIC PANEL - Abnormal; Notable for the following components:      Result Value   Total Protein 6.4 (*)    All other components within normal limits  CBC WITH DIFFERENTIAL/PLATELET  TROPONIN I    EKG EKG Interpretation  Date/Time:  Thursday July 26 2018 08:59:53 EDT Ventricular Rate:  65 PR Interval:  QRS Duration: 124 QT Interval:  387 QTC Calculation: 403 R Axis:   61 Text Interpretation:  Sinus rhythm Right bundle branch block No old tracing to compare Confirmed by Jola Schmidt [phone]) on 07/26/2018 9:20:45 AM   Radiology Dg Chest 2 View  Result Date: 07/26/2018 CLINICAL DATA:  Chest pain EXAM: CHEST - 2 VIEW COMPARISON:  Jul 31, 2009 FINDINGS: Lungs are clear. Heart size and pulmonary vascularity are normal. No adenopathy. No pneumothorax. No bone lesions. IMPRESSION: No edema or consolidation. Electronically Signed   By: Lowella Grip III M.D.   On: 07/26/2018 09:41    Procedures Procedures (including critical care time)  Medications Ordered in ED Medications - No data to display   Initial Impression / Assessment and Plan / ED Course  I have reviewed the triage vital signs and the nursing notes.  Pertinent labs & imaging results that were available during my care of the patient were reviewed by me and considered in my medical decision making (see chart for details).        Overall well-appearing.  Doubt PE.  Doubt dissection.  EKG without ischemic changes.  Troponin is negative.  Atypical chest pain presentation.  Suspect gastroesophageal reflux disease.  This been going on for several months.  She will likely need outpatient GI follow-up for endoscopy and further evaluation.  Close primary care  follow-up.  No other alterations to medications.  Patient encouraged to return the emergency department for new or worsening symptoms.   Final Clinical Impressions(s) / ED Diagnoses   Final diagnoses:  Precordial chest pain    ED Discharge Orders    None       Jola Schmidt, MD 07/26/18 [phone]

## 2018-07-26 NOTE — Telephone Encounter (Signed)
Copied from [place] [phone]. Topic: Referral - Request for Referral >> Jul 26, 2018  1:00 PM Ivar Drape wrote: Has patient seen PCP for this complaint?  NO *If NO, is insurance requiring patient see PCP for this issue before PCP can refer them? Referral for which specialty:   Gertie Fey Preferred provider/office:  [facility] Reason for referral: [place] suggested she see a gastroenterologist b/c they think it might be reflux she is experiencing

## 2018-07-26 NOTE — Telephone Encounter (Signed)
Call and see if we can do virtual visit so that we can place referral. We have opening tomorrow.

## 2018-07-26 NOTE — ED Notes (Signed)
Amy, PA and Traci, NP notified this nurse patient to go to ED.  Spoke to patient.  Patient commented on indigestion.  Instructed patient on cardiac symptoms related to women and cardiac complaints

## 2018-07-26 NOTE — ED Triage Notes (Addendum)
Pt stated for about 2 weeks she has had "indigestion", she endorses to have changed her medication & her diet with no relief of symptoms. She also c/o intermittent chest pains in the center of her chest that she feels "fluttering" &/or "tightness" denies any pain radiation or lightheadedness, this has also been going on for approx. two weeks.

## 2018-07-26 NOTE — Telephone Encounter (Signed)
See note

## 2018-07-26 NOTE — Telephone Encounter (Signed)
FYI patient went to ED

## 2018-07-27 NOTE — Telephone Encounter (Signed)
FYI, see message. 

## 2018-07-27 NOTE — Telephone Encounter (Signed)
Called pt and she stated the [facility] is going to do the referral for her.

## 2018-08-02 ENCOUNTER — Other Ambulatory Visit: Payer: Self-pay | Admitting: Internal Medicine

## 2018-09-07 ENCOUNTER — Telehealth: Payer: Self-pay | Admitting: Family Medicine

## 2018-09-07 NOTE — Telephone Encounter (Signed)
Copied from [place] [id number]. Topic: Quick Communication - Rx Refill/Question >> Sep 07, 2018  9:43 AM Margot Ables wrote: Medication: valACYclovir (VALTREX) 1000 MG tablet - pt notes that Dr. Criss Rosales ordered previously and she takes 1000mg /1gm twice daily - pt has 1 wk on hand  Has the patient contacted their pharmacy? Yes new RX needed from correct doctor Preferred Pharmacy (with phone number or street name): [facility] [phone] - [place], [place] - [address] [phone] (Phone) [phone] (Fax)

## 2018-09-11 ENCOUNTER — Other Ambulatory Visit: Payer: Self-pay

## 2018-09-11 MED ORDER — VALACYCLOVIR HCL 1 G PO TABS: 1000.0000 mg | ORAL_TABLET | Freq: Every day | ORAL | 0 refills | Status: DC

## 2018-09-11 NOTE — Telephone Encounter (Signed)
Refill sent in

## 2018-11-30 ENCOUNTER — Other Ambulatory Visit: Payer: Self-pay | Admitting: Family Medicine

## 2019-01-02 ENCOUNTER — Other Ambulatory Visit: Payer: Self-pay

## 2019-01-02 MED ORDER — VALACYCLOVIR HCL 1 G PO TABS: ORAL_TABLET | ORAL | 0 refills | Status: DC

## 2019-02-14 ENCOUNTER — Other Ambulatory Visit: Payer: Self-pay | Admitting: Family Medicine

## 2019-02-14 NOTE — Telephone Encounter (Signed)
Refilled x 30 days. Please call pt to schedule visit to establish with new PCP.

## 2019-02-14 NOTE — Telephone Encounter (Signed)
Patient declined to schedule TOC at this time. She wants to look on [place] website to review the doctors first. She stated she understands no more refills until she schedules a TOC appointment. No need to route note, for documentation purposes only.

## 2019-08-31 IMAGING — DX BILATERAL SACROILIAC JOINTS - 3+ VIEW
2 series · 2 of 2 positions shown · non-contrast
Comparison: SI joint series 11/14/2017. CT Abdomen and Pelvis
05/11/2016. Pelvis radiograph 03/14/2017.

CLINICAL DATA: 63-year-old female with chronic SI joint pain.
Paget's disease.

EXAM:
BILATERAL SACROILIAC JOINTS - 3+ VIEW

[sacroiliac joint oblique (1 of 2)]
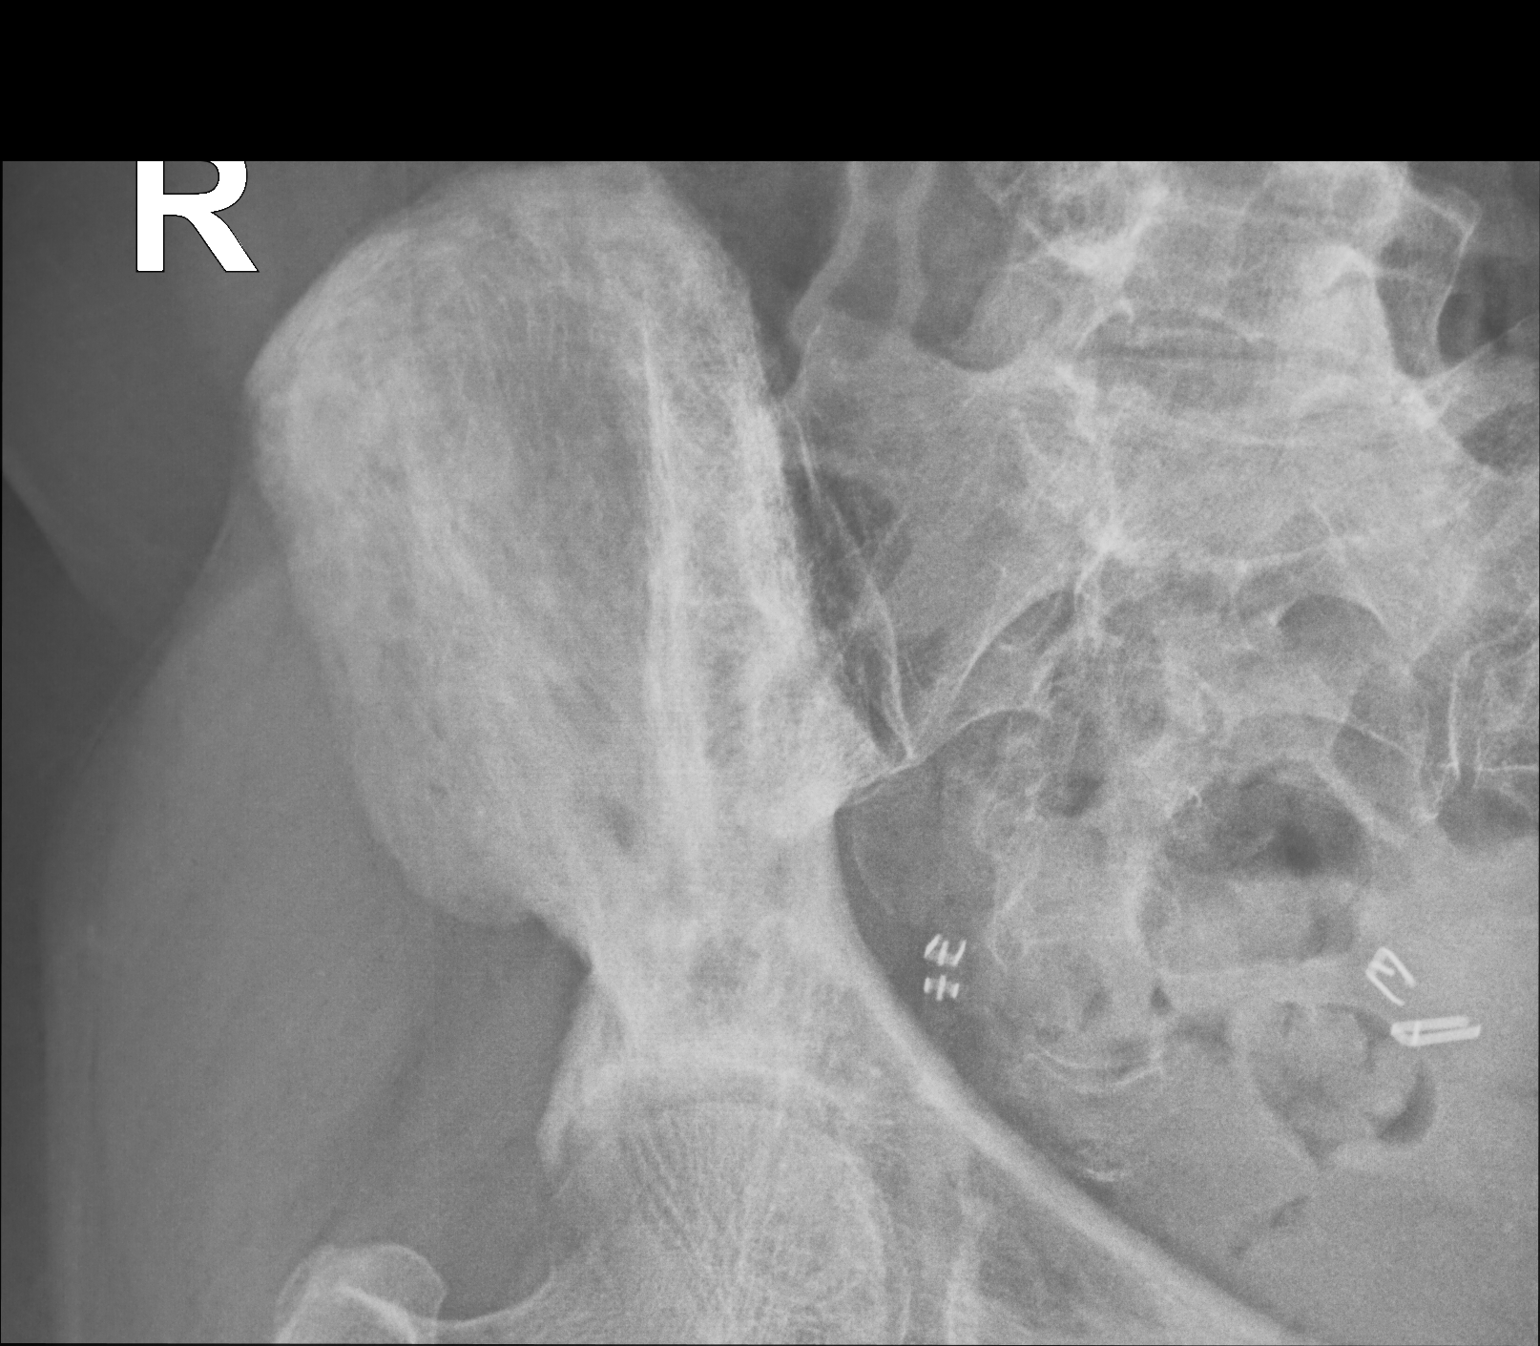

[sacroiliac joint oblique (2 of 2)]
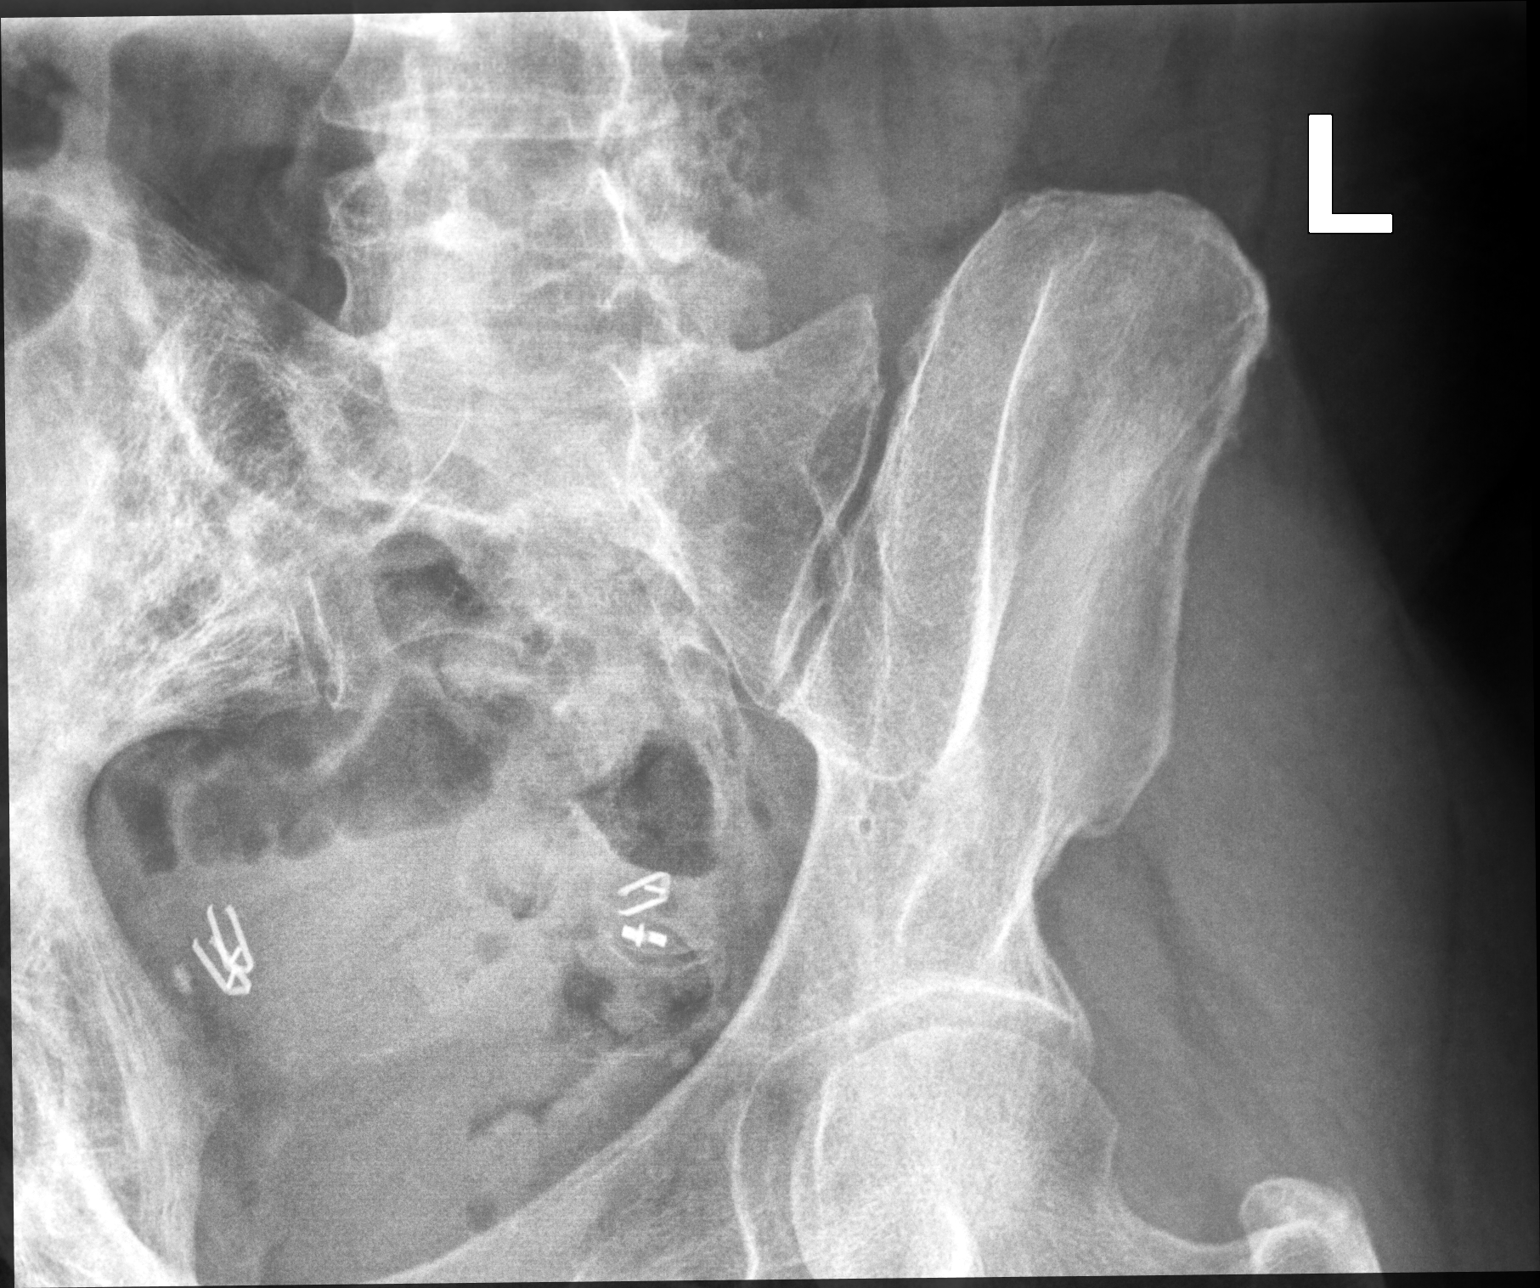

[2 of 2 positions shown; findings below may reference images not displayed]

FINDINGS: The SI joints were included on the 9986 CT, but the visible right
iliac wing was consistent with Paget's disease, which appears to
chronically involve more than half of the right hemipelvis.

There is associated irregularity of the iliac margin of the right SI
joint, but the sacral margin of that joint appears to remain normal.
The contralateral left SI joint is normal. Visible pelvis appears
intact. No acute osseous abnormality identified. Chronic tubal
ligation clips.
IMPRESSION: Chronic Paget's disease of the right hemipelvis, including the iliac
margin of the right SI joint. But otherwise normal radiographic
appearance of the SI joints.

## 2019-10-11 IMAGING — CR CHEST - 2 VIEW
2 series · 2 of 2 positions shown · non-contrast
Comparison: July 31, 2009

CLINICAL DATA: Chest pain

EXAM:
CHEST - 2 VIEW

[chest pa]
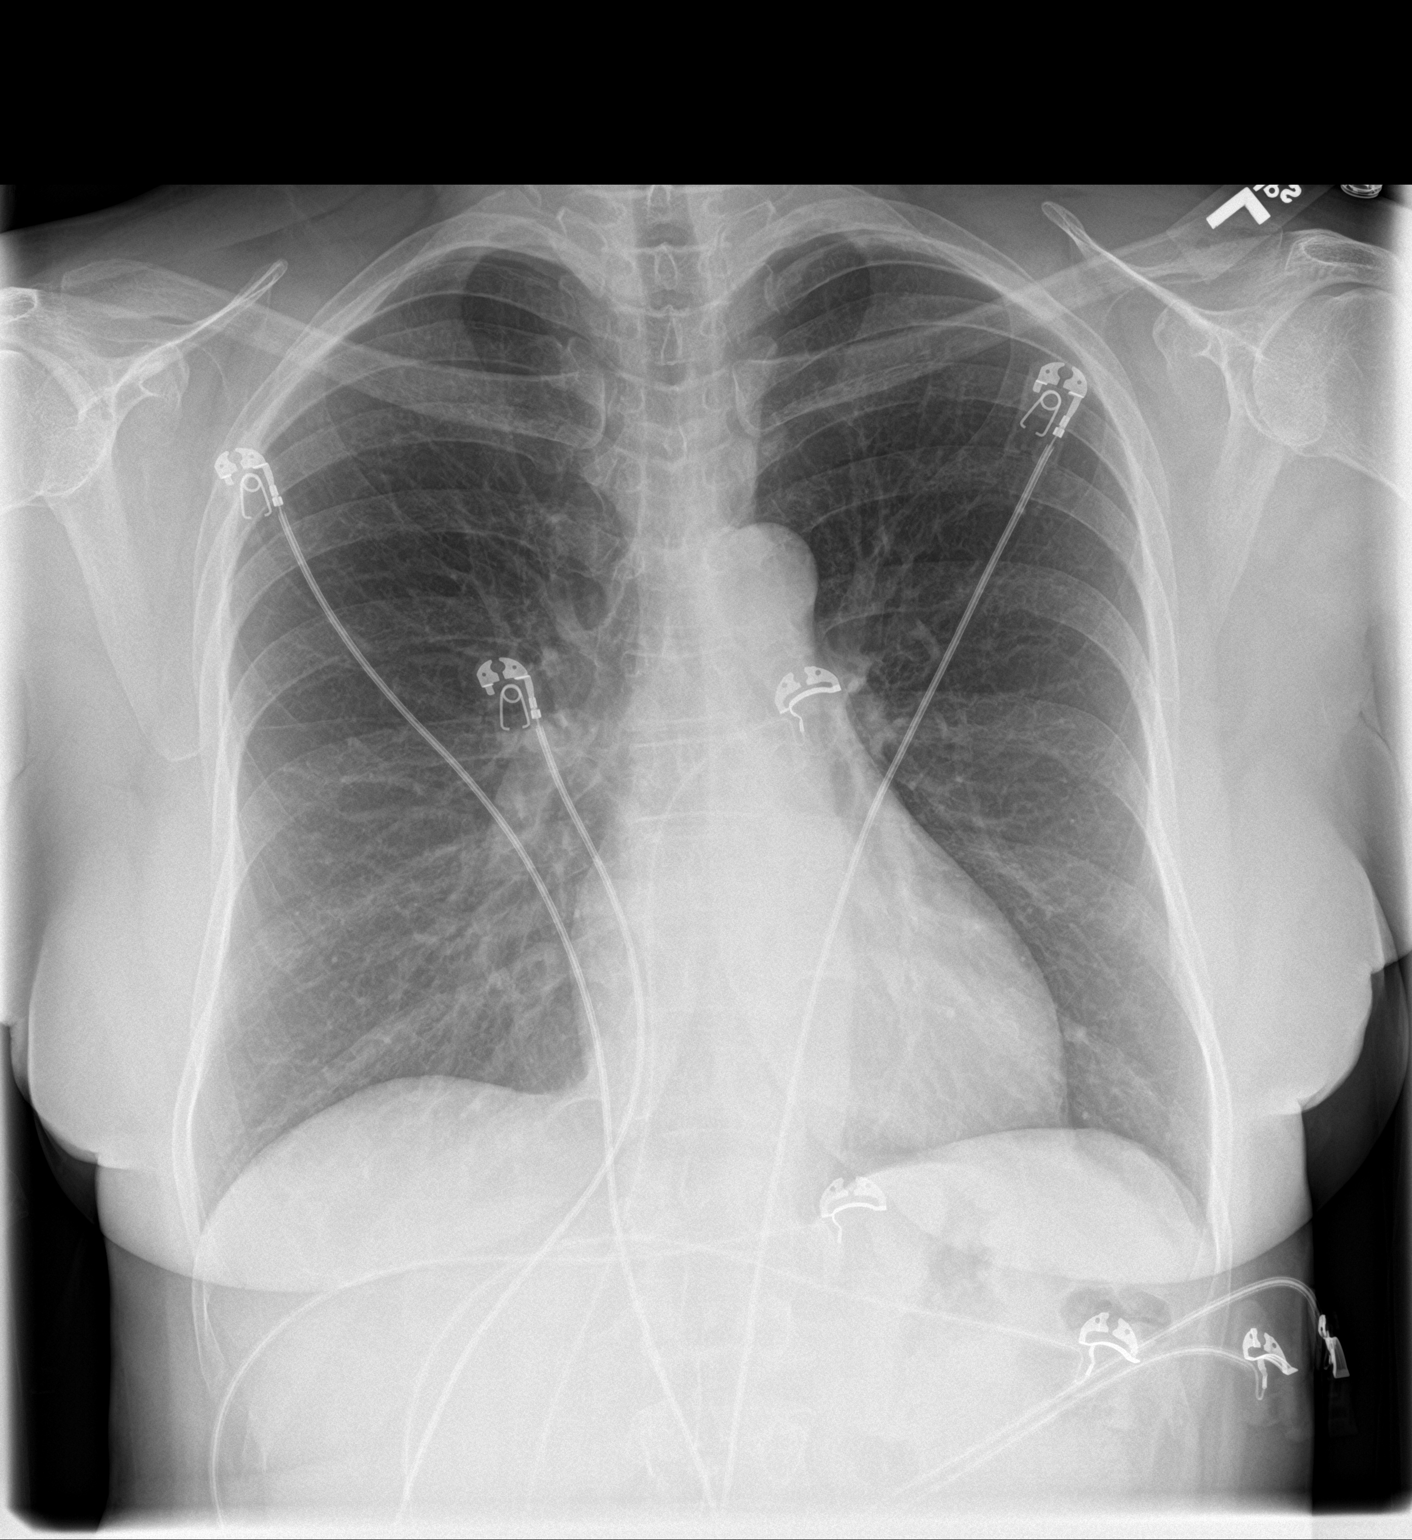

[chest lat]
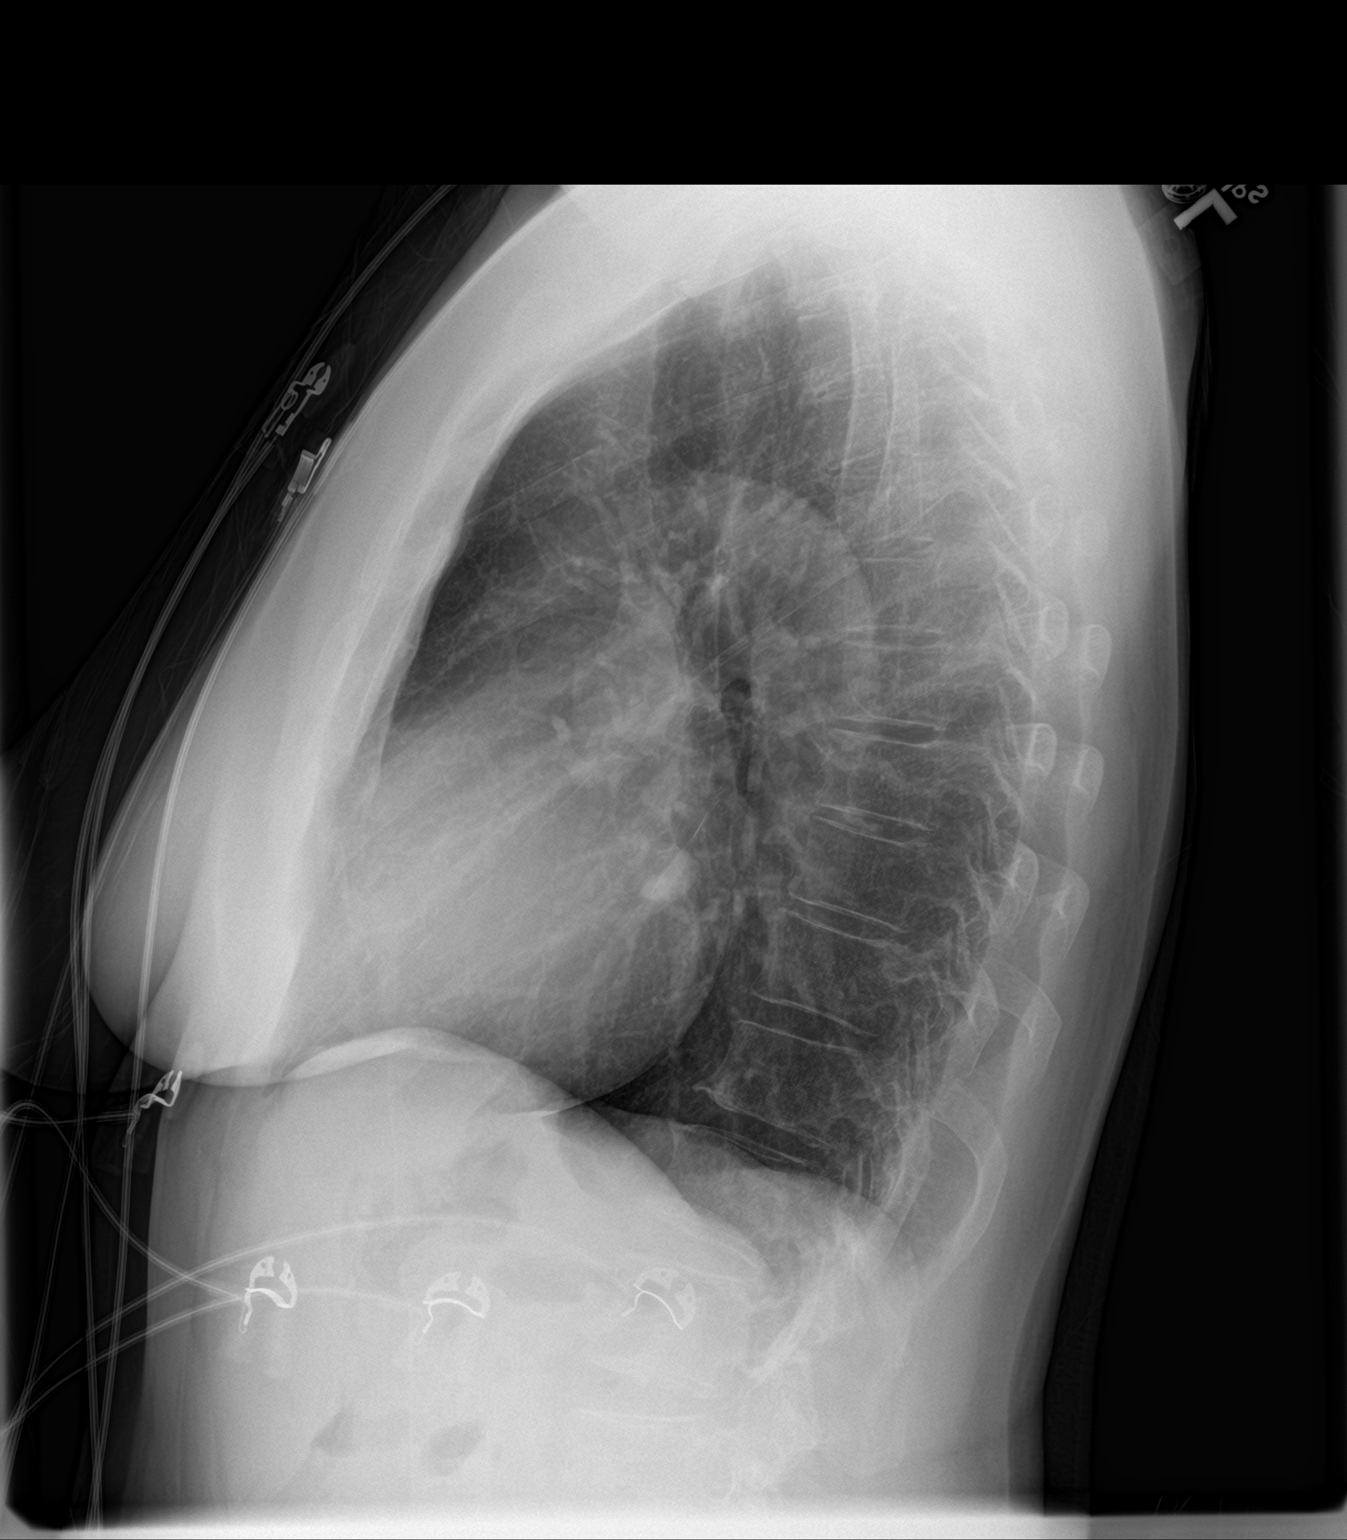

[2 of 2 positions shown; findings below may reference images not displayed]

FINDINGS: Lungs are clear. Heart size and pulmonary vascularity are normal. No
adenopathy. No pneumothorax. No bone lesions.
IMPRESSION: No edema or consolidation.

## 2020-05-30 ENCOUNTER — Other Ambulatory Visit: Payer: Self-pay

## 2020-05-30 ENCOUNTER — Ambulatory Visit (HOSPITAL_COMMUNITY): Admission: EM | Admit: 2020-05-30 | Discharge: 2020-05-30 | Disposition: A | Discharge status: Home / Self Care

## 2020-05-30 ENCOUNTER — Encounter (HOSPITAL_COMMUNITY): Payer: Self-pay | Admitting: Emergency Medicine

## 2020-05-30 DIAGNOSIS — S39012A Strain of muscle, fascia and tendon of lower back, initial encounter: Secondary | ICD-10-CM | POA: Diagnosis not present

## 2020-05-30 DIAGNOSIS — M545 Low back pain, unspecified: Secondary | ICD-10-CM

## 2020-05-30 MED ORDER — TIZANIDINE HCL 4 MG PO TABS: 4.0000 mg | ORAL_TABLET | Freq: Three times a day (TID) | ORAL | 0 refills | Status: AC | PRN

## 2020-05-30 MED ORDER — NAPROXEN 375 MG PO TABS: 375.0000 mg | ORAL_TABLET | Freq: Two times a day (BID) | ORAL | 0 refills | Status: AC
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
## 2020-06-01 ENCOUNTER — Encounter (HOSPITAL_COMMUNITY): Payer: Self-pay

## 2020-06-01 ENCOUNTER — Other Ambulatory Visit: Payer: Self-pay

## 2020-06-01 ENCOUNTER — Ambulatory Visit (HOSPITAL_COMMUNITY): Admission: EM | Admit: 2020-06-01 | Discharge: 2020-06-01 | Disposition: A | Discharge status: Home / Self Care

## 2020-06-01 DIAGNOSIS — S39012D Strain of muscle, fascia and tendon of lower back, subsequent encounter: Secondary | ICD-10-CM | POA: Diagnosis not present

## 2020-06-01 DIAGNOSIS — S161XXD Strain of muscle, fascia and tendon at neck level, subsequent encounter: Secondary | ICD-10-CM
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
## 2020-06-25 ENCOUNTER — Encounter: Payer: Self-pay | Admitting: Allergy

## 2020-06-25 ENCOUNTER — Ambulatory Visit (INDEPENDENT_AMBULATORY_CARE_PROVIDER_SITE_OTHER): Payer: No Typology Code available for payment source | Admitting: Allergy

## 2020-06-25 ENCOUNTER — Other Ambulatory Visit: Payer: Self-pay

## 2020-06-25 VITALS — BP 112/70 | HR 90 | Temp 96.9°F | Resp 16 | Ht 64.5 in | Wt 169.5 lb

## 2020-06-25 DIAGNOSIS — R12 Heartburn: Secondary | ICD-10-CM | POA: Insufficient documentation

## 2020-06-25 DIAGNOSIS — T781XXD Other adverse food reactions, not elsewhere classified, subsequent encounter: Secondary | ICD-10-CM

## 2020-06-25 DIAGNOSIS — R059 Cough, unspecified: Secondary | ICD-10-CM | POA: Diagnosis not present

## 2020-06-25 DIAGNOSIS — J310 Chronic rhinitis: Secondary | ICD-10-CM | POA: Diagnosis not present

## 2020-06-25 DIAGNOSIS — H01004 Unspecified blepharitis left upper eyelid: Secondary | ICD-10-CM

## 2020-06-25 NOTE — Assessment & Plan Note (Signed)
Left upper eyelid erythema and swelling.  Put a warm, wet compress on the eyelid. Wet a clean washcloth with warm water and put it over your eyelid. When the washcloth cools, reheat it with warm water and put it back over the eyelid. Repeat these steps for about 15 minutes, and try to do this 4 times a day.  Wash the eyelid with baby shampoo to clean the area.  You may use artificial tears.   You should not wear eye makeup or contact lenses until your eyelid is better.  See your PCP or eye doctor if:   Symptoms don't go away after using warm compresses for 1 to 2 weeks  It gets very big, bleeds, or affects your vision  Your whole eye is red, or your whole eyelid is red or swollen  The redness or swelling spreads to your cheek or other parts of your face

## 2020-06-25 NOTE — Assessment & Plan Note (Signed)
   See below for lifestyle and dietary modifications.

## 2020-06-25 NOTE — Assessment & Plan Note (Signed)
Coughing on and off since 1995 with no triggers noted. Some years the coughing persists for few months at a time and some years there is no coughing. No prior inhaler use. CT sinus and spirometry done at [facility] was normal per records. No coughing this year. Concerned about allergies. History of reflux with EGD and dilatation.   Today's skin testing showed: Negative to indoor/outdoor allergens.  Today's spirometry was normal.  . The most common causes of chronic cough include the following: upper airway cough syndrome (UACS) which is caused by variety of rhinitis conditions; asthma; gastroesophageal reflux disease (GERD); chronic bronchitis from cigarette smoking or other inhaled environmental irritants; non-asthmatic eosinophilic bronchitis; and bronchiectasis.  . In prospective studies, these conditions have accounted for up to 94% of the causes of chronic cough in immunocompetent adults.  . Based on her clinical history and testing results, concerning if the coughing is due to reflux/heartburn rather than any allergic triggers.

## 2020-06-25 NOTE — Progress Notes (Signed)
New Patient Note  RE: Natalie Cline MRN: [id number] DOB: 08-Sep-1955 Date of Office Visit: 06/25/2020  Referring provider: Rickard Rhymes* Primary care provider: Rickard Rhymes, DO  Chief Complaint: Cough (  ) and Facial Swelling  History of Present Illness: I had the pleasure of seeing Natalie Cline for initial evaluation at the [facility] on 06/25/2020. She is a 65 y.o. female, who is referred here by Rickard Rhymes, DO for the evaluation of coughing.   Coughing:  She reports symptoms of dry coughing which started in 1995 while she was stationed in [place]. Since then she had 3-4 episodes of coughing fits mainly in the late winter/early spring months. The coughing lasts for a few months at a time with no clear triggers noted. This does not happen ever year though. No prior inhaler use. Patient had a spirometry done and CT sinus which was normal per [place] records.  No coughing this year.   Interference with physical activity: no. Sleep is undisturbed. In the last 12 months, emergency room visits/urgent care visits/doctor office visits or hospitalizations due to respiratory issues: 0. In the last 12 months, oral steroids courses: 0. Lifetime history of hospitalization for respiratory issues: no. Prior intubations: no. History of pneumonia: no. She was evaluated by allergist/pulmonologist in the past. Smoking exposure: smoked from age [age]. Up to date with flu vaccine: no. Up to date with COVID-19 vaccine: yes.  No prior COVID-19 diagnosis.  History of reflux: yes and used to be on medications. Patient had EGD and dilatation possibly. She did have some difficulty swallowing in the past.   She reports symptoms of itchy nose, rhinorrhea, itchy/watery eyes. Symptoms have been going on currently for 2 weeks. Previous work up includes: skin testing in [place] was positive to grass, trees, pet dander per patient report. No prior allergy injections.  Previous ENT  evaluation: no.  History of nasal polyps: no. Last eye exam: 2 years ago  Assessment and Plan: Edan is a 65 y.o. female with: Coughing Coughing on and off since 1995 with no triggers noted. Some years the coughing persists for few months at a time and some years there is no coughing. No prior inhaler use. CT sinus and sp[facility]rometry done at [facility] was normal per records. No coughing this year. Concerned about allergies. History of reflux with EGD and dilatation.   Today's skin testing showed: Negative to indoor/outdoor allergens.  Today's spirometry was normal.  . The most common causes of chronic cough include the following: upper airway cough syndrome (UACS) which is caused by variety of rhinitis conditions; asthma; gastroesophageal reflux disease (GERD); chronic bronchitis from cigarette smoking or other inhaled environmental irritants; non-asthmatic eosinophilic bronchitis; and bronchiectasis.  . In prospective studies, these conditions have accounted for up to 94% of the causes of chronic cough in immunocompetent adults.  . Based on her clinical history and testing results, concerning if the coughing is due to reflux/heartburn rather than any allergic triggers.   Heartburn  See below for lifestyle and dietary modifications.  Adverse reaction to food, subsequent encounter Shellfish caused hives in the past. Takes benadryl prn when she consumes shellfish. No issues with finned fish.  Today's skin testing was negative to shellfish.   Limit shellfish intake.  For mild symptoms you can take over the counter antihistamines such as Benadryl and monitor symptoms closely. If symptoms worsen or if you have severe symptoms including breathing issues, throat closure, significant swelling, whole body hives,  severe diarrhea and vomiting, lightheadedness then seek immediate medical care.  Declines epinephrine device.  Get bloodwork to shellfish panel.   Blepharitis of left upper eyelid Left upper  eyelid erythema and swelling.  Put a warm, wet compress on the eyelid. Wet a clean washcloth with warm water and put it over your eyelid. When the washcloth cools, reheat it with warm water and put it back over the eyelid. Repeat these steps for about 15 minutes, and try to do this 4 times a day.  Wash the eyelid with baby shampoo to clean the area.  You may use artificial tears.   You should not wear eye makeup or contact lenses until your eyelid is better.  See your PCP or eye doctor if:   Symptoms don't go away after using warm compresses for 1 to 2 weeks  It gets very big, bleeds, or affects your vision  Your whole eye is red, or your whole eyelid is red or swollen  The redness or swelling spreads to your cheek or other parts of your face  Return if symptoms worsen or fail to improve.  No orders of the defined types were placed in this encounter.   Lab Orders     Allergen Profile, Shellfish     Allergens w/Total IgE Area 2  Other allergy screening: Food allergy: yes  Shellfish - caused hives in the past.  Patient still eats shellfish but takes benadryl with it. No issues with finned fish Medication allergy: yes  Reaction to IV dye.  Hymenoptera allergy: no Urticaria: no Eczema:no History of recurrent infections suggestive of immunodeficency: no  Diagnostics: Spirometry:  Tracings reviewed. Her effort: Good reproducible efforts. FVC: 3.13L FEV1: 2.36L, 90% predicted FEV1/FVC ratio: 75% Interpretation: Spirometry consistent with normal pattern.  Please see scanned spirometry results for details.  Skin Testing: Environmental allergy panel and select foods. Negative to indoor/outdoor allergens. Negative to shellfish panel. Results discussed with patient/family.  Airborne Adult Perc - 06/25/20 1010    Time Antigen Placed 1000    Allergen Manufacturer Greer    Location Back    Number of Test 59    Panel 1 Select    1. Control-Buffer 50% Glycerol Negative    2.  Control-Histamine 1 mg/ml 2+    3. Albumin saline Negative    4. Old Fig Garden Negative    5. [place] Negative    6. Johnson Negative    7. Northome Blue Negative    8. Meadow Fescue Negative    9. Perennial Rye Negative    10. Sweet Vernal Negative    11. Timothy Negative    12. Cocklebur Negative    13. Burweed Marshelder Negative    14. Ragweed, short Negative    15. Ragweed, Giant Negative    16. Plantain,  English Negative    17. Lamb's Quarters Negative    18. Sheep Sorrell Negative    19. Rough Pigweed Negative    20. Marsh Elder, Rough Negative    21. Mugwort, Common Negative    22. Ash mix Negative    23. Birch mix Negative    24. Beech American Negative    25. Box, Elder Negative    26. Cedar, red Negative    27. Cottonwood, [place] Negative    28. Elm mix Negative    29. Hickory Negative    30. Maple mix Negative    31. Oak, [place] mix Negative    32. Pecan Pollen Negative    33. [employer]  Negative    34. Sycamore Eastern Negative    35. Petros, Black Pollen Negative    36. Alternaria alternata Negative    37. Cladosporium Herbarum Negative    38. Aspergillus mix Negative    39. Penicillium mix Negative    40. Bipolaris sorokiniana (Helminthosporium) Negative    41. Drechslera spicifera (Curvularia) Negative    42. Mucor plumbeus Negative    43. Fusarium moniliforme Negative    44. Aureobasidium pullulans (pullulara) Negative    45. Rhizopus oryzae Negative    46. Botrytis cinera Negative    47. Epicoccum nigrum Negative    48. Phoma betae Negative    49. Candida Albicans Negative    50. Trichophyton mentagrophytes Negative    51. Mite, D Farinae  5,000 AU/ml Negative    52. Mite, D Pteronyssinus  5,000 AU/ml Negative    53. Cat Hair 10,000 BAU/ml Negative    54.  Dog Epithelia Negative    55. Mixed Feathers Negative    56. Horse Epithelia Negative    57. Cockroach, German Negative    58. Mouse Negative    59. Tobacco Leaf Negative          Food Adult  Perc - 06/25/20 1000    Time Antigen Placed 1000    Allergen Manufacturer Greer    Location Back    Number of allergen test 6    8. Shellfish Mix Negative    25. Shrimp Negative    26. Crab Negative    27. Lobster Negative    28. Oyster Negative    29. Scallops Negative           Past Medical History: Patient Active Problem List   Diagnosis Date Noted  . Coughing 06/25/2020  . Adverse reaction to food, subsequent encounter 06/25/2020  . Heartburn 06/25/2020  . Blepharitis of left upper eyelid 06/25/2020  . Genital warts 07/21/2018  . Mild carpal tunnel syndrome of right wrist 06/17/2018  . Hyperbilirubinemia 06/17/2018  . Liver hemangioma 06/17/2018  . Grief at loss of child, son died in his 54s of heart disease 06/17/2018  . 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 History of chest pain, 2016, after loss of son, normal cardiac testing 06/17/2018  . Hx of breast reduction, elective 06/14/2018  . Bilateral chronic knee pain 01/02/2018   Past Medical History:  Diagnosis Date  . Angio-edema   . Bilateral chronic knee pain 01/02/2018  . Chicken pox   . Grief at loss of child, son died in his 72s of heart disease 06/17/2018  . History of chest pain, 2016, after loss of son, normal cardiac testing 06/17/2018  . History of chicken pox   . History of genital warts   . Hx of breast reduction, elective 06/14/2018  . Hyperbilirubinemia 06/17/2018  . Liver hemangioma 06/17/2018   CT 2018. Hepatobiliary: Normal liver size. No evidence of liver surface irregularity. There is a 2.3 x 2.3 cm superior right liver lobe mass (series 4/image 20) demonstrating progressive discontinuous peripheral nodular enhancement, compatible with a benign hemangioma. There are 3 scattered subcentimeter hypodense lesions in the right liver lobe, too small to characterize, which require no follow-  . Mild carpal tunnel syndrome of right wrist 12/02/2016  . Paget's disease of bone at multiple  sites 06/17/2018  . Vaginal delivery, x 3    Past Surgical History: Past Surgical History:  Procedure Laterality Date  . BREAST CYST EXCISION Left 1982   removed from  right breast   . BREAST REDUCTION SURGERY Bilateral 2002  . LASIK Bilateral 2017  . TUBAL LIGATION  1990  . UMBILICAL HERNIA REPAIR  2004   Medication List:  Current Outpatient Medications  Medication Sig Dispense Refill  . 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 calcium-vitamin D 250-100 MG-UNIT tablet Take 1 tablet by mouth daily.    . diclofenac (VOLTAREN) 75 MG EC tablet Take 75 mg by mouth daily.    . fluticasone (FLONASE) 50 MCG/ACT nasal spray INSTILL 1 SPRAY IN EACH NOSTRIL TWICE A DAY    . Multiple Vitamin (MULTIVITAMIN) tablet Take 1 tablet by mouth daily.    Marland Kitchen triamcinolone (KENALOG) 0.1 % APPLY SMALL AMOUNT TO AFFECTED AREA TWICE A DAY AS NEEDED    . baclofen (LIORESAL) 10 MG tablet Take 10 mg by mouth daily. (Patient not taking: No sig reported)    . naproxen (NAPROSYN) 375 MG tablet Take 1 tablet (375 mg total) by mouth 2 (two) times daily with a meal. (Patient not taking: No sig reported) 30 tablet 0  . tiZANidine (ZANAFLEX) 4 MG tablet Take 1 tablet (4 mg total) by mouth every 8 (eight) hours as needed. (Patient not taking: No sig reported) 30 tablet 0  . valACYclovir (VALTREX) 1000 MG tablet TAKE 1 TABLET(1000 MG) BY MOUTH DAILY (Patient not taking: No sig reported) 30 tablet 0   No current facility-administered medications for this visit.   Allergies: Allergies  Allergen Reactions  . Iodinated Diagnostic Agents Hives and Rash    Per patient   . Hydrocodone     Other reaction(s): Nausea and Vomiting  . Iodine   . Penicillin G Hives  . Penicillins Other (See Comments)  . Vicodin Hp [Hydrocodone-Acetaminophen] Nausea And Vomiting   Social History: Social History   Socioeconomic History  . Marital status: Single    Spouse name: Not on file   . Number of children: Not on file  . Years of education: Not on file  . Highest education level: Not on file  Occupational History  . Not on file  Tobacco Use  . Smoking status: Former Smoker    Years: 2.00  . Smokeless tobacco: Never Used  Vaping Use  . Vaping Use: Never used  Substance and Sexual Activity  . Alcohol use: Yes    Comment: 1-2 glasses of wine per month  . Drug use: Never  . Sexual activity: Not Currently  Other Topics Concern  . 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   Patient does take vitamin supplements, has a smoke-alarm at home, own guns/firearms, wears seatbelt and feels safe in his relationship.    Social Determinants of Health   Financial Resource Strain: Not on file  Food Insecurity: Not on file  Transportation Needs: Not on file  Physical Activity: Not on file  Stress: Not on file  Social Connections: Not on file   Lives in a house which is 18+ years old. Smoking: denies Occupation: HR specialist  Environmental History: Water Damage/mildew in the house: no Carpet in the family room: no Carpet in the bedroom: no Heating: gas Cooling: central Pet: no  Family History: Family History  Problem Relation Age of Onset  . Hypertension Son   . Stroke Mother   . Alcohol abuse Father   . Hypertension Sister   . Stroke Brother   . Alcohol abuse Sister   .  Drug abuse Sister   . Hypertension Sister   . Stroke Sister   . Early death Son   . Heart disease Son    Problem                               Relation Asthma                                   No  Eczema                                Granddaughter  Food allergy                          No  Allergic rhino conjunctivitis     No   Review of Systems  Constitutional: Negative for appetite change, chills, fever and unexpected weight change.  HENT: Positive for rhinorrhea. Negative for congestion.   Eyes: Positive for itching.  Respiratory:  Negative for cough, chest tightness, shortness of breath and wheezing.   Cardiovascular: Negative for chest pain.  Gastrointestinal: Negative for abdominal pain.  Genitourinary: Negative for difficulty urinating.  Skin: Positive for rash.  Neurological: Negative for headaches.   Objective: BP 112/70   Pulse 90   Temp (!) 96.9 F (36.1 C) (Temporal)   Resp 16   Ht 5' 4.5" (1.638 m)   Wt 169 lb 8 oz (76.9 kg)   SpO2 98%   BMI 28.65 kg/m  Body mass index is 28.65 kg/m. Physical Exam Vitals and nursing note reviewed.  Constitutional:      Appearance: Normal appearance. She is well-developed.  HENT:     Head: Normocephalic and atraumatic.     Right Ear: Tympanic membrane and external ear normal.     Left Ear: Tympanic membrane and external ear normal.     Nose: Nose normal.     Mouth/Throat:     Mouth: Mucous membranes are moist.     Pharynx: Oropharynx is clear.  Eyes:     Conjunctiva/sclera: Conjunctivae normal.     Comments: Left upper eyelid erythema with some swelling  Cardiovascular:     Rate and Rhythm: Normal rate and regular rhythm.     Heart sounds: Normal heart sounds. No murmur heard. No friction rub. No gallop.   Pulmonary:     Effort: Pulmonary effort is normal.     Breath sounds: Normal breath sounds. No wheezing, rhonchi or rales.  Musculoskeletal:     Cervical back: Neck supple.  Skin:    General: Skin is warm.     Findings: No rash.  Neurological:     Mental Status: She is alert and oriented to person, place, and time.  Psychiatric:        Behavior: Behavior normal.    The plan was reviewed with the patient/family, and all questions/concerned were addressed.  It was my pleasure to see Grissel today and participate in her care. Please feel free to contact me with any questions or concerns.  Sincerely,  Rexene Alberts, DO Allergy & Immunology  [facility] office: [place] office:  [phone]

## 2020-06-25 NOTE — Patient Instructions (Addendum)
Today's skin testing showed: Negative to indoor/outdoor allergens. Negative to shellfish panel. Will double check results via bloodwork.  Coughing:  Breathing test today was normal.  . The most common causes of chronic cough include the following: upper airway cough syndrome (UACS) which is caused by variety of rhinitis conditions; asthma; gastroesophageal reflux disease (GERD); chronic bronchitis from cigarette smoking or other inhaled environmental irritants; non-asthmatic eosinophilic bronchitis; and bronchiectasis.  . In prospective studies, these conditions have accounted for up to 94% of the causes of chronic cough in immunocompetent adults.  . Based on your history, concerning if the coughing is due to reflux/heartburn.  Eyelid:  Put a warm, wet compress on the eyelid. Wet a clean washcloth with warm water and put it over your eyelid. When the washcloth cools, reheat it with warm water and put it back over the eyelid. Repeat these steps for about 15 minutes, and try to do this 4 times a day.  Wash the eyelid with baby shampoo to clean the area.  You may use artificial tears.   You should not wear eye makeup or contact lenses until your eyelid is better.  See your PCP or eye doctor if:   Symptoms don't go away after using warm compresses for 1 to 2 weeks  It gets very big, bleeds, or affects your vision  Your whole eye is red, or your whole eyelid is red or swollen  The redness or swelling spreads to your cheek or other parts of your face  Food:  Limit shellfish intake.  For mild symptoms you can take over the counter antihistamines such as Benadryl and monitor symptoms closely. If symptoms worsen or if you have severe symptoms including breathing issues, throat closure, significant swelling, whole body hives, severe diarrhea and vomiting, lightheadedness then seek immediate medical care.  Get bloodwork:  We are ordering labs, so please allow 1-2 weeks for the results to  come back. With the newly implemented Cures Act, the labs might be visible to you at the same time that they become visible to me. However, I will not address the results until all of the results are back, so please be patient.    Follow up as needed.   Heartburn Heartburn is a type of pain or discomfort that can happen in your throat or chest. It is often described as a burning pain. It may also cause a bad, acid-like taste in your mouth. It may be caused by stomach contents that move back up (reflux) into the part of the body that moves food from your mouth to your stomach (esophagus). Heartburn may feel worse:  When you lie down.  When you bend over.  At night. Follow these instructions at home: Eating and drinking  Avoid certain foods and drinks as told by your doctor. This may include: ? Coffee and tea, with or without caffeine. ? Drinks that have alcohol. ? Energy drinks and sports drinks. ? Carbonated drinks or sodas. ? Chocolate and cocoa. ? Peppermint and mint flavorings. ? Garlic and onions. ? Horseradish. ? Spicy and acidic foods, such as:  Peppers.  Chili powder and curry powder.  Vinegar.  Hot sauces and BBQ sauce. ? Citrus fruit juices and citrus fruits, such as:  Oranges.  Lemons.  Limes. ? Tomato-based foods, such as:  Red sauce and pizza with red sauce.  Chili.  Salsa. ? Fried and fatty foods, such as:  Donuts.  [place] fries and potato chips.  High-fat dressings. ? High-fat meats, such  as:  Hot dogs and sausage.  Rib eye steak.  Ham and bacon. ? High-fat dairy items, such as:  Whole milk.  Butter.  Cream cheese.  Eat small meals often. Avoid eating large meals.  Avoid drinking large amounts of liquid with your meals.  Avoid eating meals during the 2-3 hours before bedtime.  Avoid lying down right after you eat.  Do not exercise right after you eat.   Lifestyle  If you are overweight, lose an amount of weight that is  healthy for you. Ask your doctor about a safe weight loss goal.  Do not smoke or use any products that contain nicotine or tobacco. These can make your symptoms worse. If you need help quitting, ask your doctor.  Wear loose clothes. Do not wear anything tight around your waist.  Raise (elevate) the head of your bed about 6 inches (15 cm) when you sleep. You can use a wedge to do this.  Try to lower your stress. If you need help doing this, ask your doctor.      Medicines  Take over-the-counter and prescription medicines only as told by your doctor.  Do not take aspirin or NSAIDs, such as ibuprofen, unless your doctor says it is okay.  Stop medicines only as told by your doctor. If you stop taking some medicines too quickly, your symptoms may get worse. General instructions  Watch for any changes in your symptoms.  Keep all follow-up visits. Contact a doctor if:  You have new symptoms.  You lose weight and you do not know why.  You have trouble swallowing, or it hurts to swallow.  You have wheezing or a cough that keeps happening.  Your symptoms do not get better with treatment.  You have heartburn often for more than 2 weeks. Get help right away if:  You have pain in your arms, neck, jaw, teeth, or back all of a sudden.  You feel sweaty, dizzy, or light-headed all of a sudden.  You have chest pain or shortness of breath.  You vomit and your vomit looks like blood or coffee grounds.  Your poop (stool) is bloody or black. These symptoms may be an emergency. Get help right away. Call your local emergency services (911 in the [place]  Do not wait to see if the symptoms will go away.  Do not drive yourself to the hospital. Summary  Heartburn is a type of pain that can happen in your throat or chest. It can feel like a burning pain. It may also cause a bad, acid-like taste in your mouth.  You may need to avoid certain foods and drinks to help your symptoms. Ask your  doctor what foods and drinks you should avoid.  Take over-the-counter and prescription medicines only as told by your doctor. Do not take aspirin or NSAIDs, such as ibuprofen, unless your doctor told you to do so.  Contact your doctor if your symptoms do not get better or they get worse. This information is not intended to replace advice given to you by your health care provider. Make sure you discuss any questions you have with your health care provider. Document Revised: 09/18/2019 Document Reviewed: 09/18/2019 Elsevier Patient Education  [place].

## 2020-06-25 NOTE — Assessment & Plan Note (Addendum)
Shellfish caused hives in the past. Takes benadryl prn when she consumes shellfish. No issues with finned fish.  Today's skin testing was negative to shellfish.   Limit shellfish intake.  For mild symptoms you can take over the counter antihistamines such as Benadryl and monitor symptoms closely. If symptoms worsen or if you have severe symptoms including breathing issues, throat closure, significant swelling, whole body hives, severe diarrhea and vomiting, lightheadedness then seek immediate medical care.  Declines epinephrine device.  Get bloodwork to shellfish panel.

## 2020-07-01 LAB — ALLERGEN PROFILE, SHELLFISH
Clam IgE: <0.1 kU/L
F023-IgE Crab: <0.1 kU/L
F080-IgE Lobster: <0.1 kU/L
F290-IgE Oyster: <0.1 kU/L
Scallop IgE: <0.1 kU/L
Shrimp IgE: 0.14 kU/L — AB

## 2020-07-01 LAB — ALLERGENS W/TOTAL IGE AREA 2

## 2020-08-27 ENCOUNTER — Ambulatory Visit: Admitting: Registered"
